# Patient Record
Sex: Male | Born: 1985 | Race: Black or African American | Hispanic: No | Marital: Married | Smoking: Former smoker
Health system: Southern US, Community
[De-identification: ages and names within clinical notes are randomized; demographics above are authoritative.]

---
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
## 2009-06-19 ENCOUNTER — Emergency Department (HOSPITAL_COMMUNITY): Admission: EM | Admit: 2009-06-19 | Discharge: 2009-06-19 | Payer: Self-pay | Admitting: Emergency Medicine
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
## 2017-12-03 ENCOUNTER — Encounter (HOSPITAL_COMMUNITY): Payer: Self-pay | Admitting: Family Medicine

## 2017-12-03 ENCOUNTER — Ambulatory Visit (HOSPITAL_COMMUNITY)
Admission: EM | Admit: 2017-12-03 | Discharge: 2017-12-03 | Disposition: A | Discharge status: Home / Self Care | Payer: Medicaid Other | Attending: Internal Medicine | Admitting: Internal Medicine

## 2017-12-03 DIAGNOSIS — J069 Acute upper respiratory infection, unspecified: Secondary | ICD-10-CM | POA: Diagnosis not present

## 2017-12-03 DIAGNOSIS — B9789 Other viral agents as the cause of diseases classified elsewhere: Secondary | ICD-10-CM

## 2017-12-03 MED ORDER — BENZONATATE 100 MG PO CAPS: 200.0000 mg | ORAL_CAPSULE | Freq: Three times a day (TID) | ORAL | 0 refills | Status: AC

## 2017-12-03 MED ORDER — FLUTICASONE PROPIONATE 50 MCG/ACT NA SUSP: 1.0000 | Freq: Every day | NASAL | 0 refills | Status: DC

## 2017-12-03 MED ORDER — CETIRIZINE HCL 10 MG PO CAPS: 10.0000 mg | ORAL_CAPSULE | Freq: Every day | ORAL | 0 refills | Status: DC

## 2017-12-03 NOTE — ED Triage Notes (Signed)
Pt here for 2 days of cough and body aches. Reports that both kids are sick at home.

## 2017-12-03 NOTE — Discharge Instructions (Signed)
You are having a viral cold. We recommended symptom control. I expect your symptoms to start improving in the next 1-2 weeks.   1. Take a daily allergy pill/anti-histamine like Zyrtec, Claritin, or Store brand consistently for 2 weeks-I have sent this in for you.  2. For congestion I have sent in Flonase to use spray in each nostril once daily, you may also try Mucinex or Sudafed.  3. For your sore throat you may try cepacol lozenges, salt water gargles, throat spray. Treatment of congestion may also help your sore throat.  4. For cough I have sent in Tessalon to use as needed.  You may also try Delsym or Robitussin over-the-counter.  5. Take Tylenol or Ibuprofen to help with pain, headache.  6. Stay hydrated, drink plenty of fluids to keep throat coated and less irritated  Honey Tea For cough/sore throat try using a honey-based tea. Use 3 teaspoons of honey with juice squeezed from half lemon. Place shaved pieces of ginger into 1/2-1 cup of water and warm over stove top. Then mix the ingredients and repeat every 4 hours as needed.

## 2017-12-03 NOTE — ED Provider Notes (Signed)
[facility]    CSN: [id number] Arrival date & time: 12/03/17  1043     History   Chief Complaint Chief Complaint  Patient presents with  . Generalized Body Aches  . Cough    HPI Eugene Lloyd is a 32 y.o. male no significant past medical history, Patient is presenting with URI symptoms- congestion, cough, mild sore throat.  Also having myalgias and a headache.  Headache feels like a pressure sensation as if he is having a head cold.  States cough is occasional, dry and infrequent.  Patient's main complaints are concerned about if he has what his kids have-were here yesterday 1 treated for pneumonia 1 treated for bronchitis. Symptoms have been going on for 2 days. Patient has tried cold and sinus over-the-counter medicine, with minimal relief. Denies fever, nausea, vomiting, diarrhea. Denies shortness of breath and chest pain.    HPI  History reviewed. No pertinent past medical history.  There are no active problems to display for this patient.   History reviewed. No pertinent surgical history.     Home Medications    Prior to Admission medications   Medication Sig Start Date End Date Taking? Authorizing Provider  benzonatate (TESSALON) 100 MG capsule Take 2 capsules (200 mg total) by mouth every 8 (eight) hours for 5 days. 12/03/17 12/08/17  Wieters, Hallie C, PA-C  Cetirizine HCl 10 MG CAPS Take 1 capsule (10 mg total) by mouth daily for 14 days. 12/03/17 12/17/17  Wieters, Hallie C, PA-C  fluticasone (FLONASE) 50 MCG/ACT nasal spray Place 1 spray into both nostrils daily for 7 days. 12/03/17 12/10/17  Wieters, Junius CreamerHallie C, PA-C    Family History History reviewed. No pertinent family history.  Social History Social History   Tobacco Use  . Smoking status: Current Every Day Smoker  . Smokeless tobacco: Never Used  Substance Use Topics  . Alcohol use: Not on file  . Drug use: Not on file     Allergies   Patient has no known allergies.   Review of  Systems Review of Systems  Constitutional: Negative for activity change, appetite change, fatigue and fever.  HENT: Positive for congestion, postnasal drip, rhinorrhea, sinus pressure and sore throat. Negative for ear pain.   Eyes: Negative for pain and itching.  Respiratory: Positive for cough. Negative for shortness of breath.   Cardiovascular: Negative for chest pain.  Gastrointestinal: Negative for abdominal pain, diarrhea, nausea and vomiting.  Musculoskeletal: Positive for myalgias.  Skin: Negative for rash.  Neurological: Positive for headaches. Negative for dizziness and light-headedness.     Physical Exam Triage Vital Signs ED Triage Vitals  Enc Vitals Group     BP 12/03/17 1130 132/74     Pulse Rate 12/03/17 1130 71     Resp 12/03/17 1130 18     Temp 12/03/17 1130 98.1 F (36.7 C)     Temp src --      SpO2 12/03/17 1130 100 %     Weight --      Height --      Head Circumference --      Peak Flow --      Pain Score 12/03/17 1128 8     Pain Loc --      Pain Edu? --      Excl. in GC? --    No data found.  Updated Vital Signs BP 132/74   Pulse 71   Temp 98.1 F (36.7 C)   Resp 18  SpO2 100%    Physical Exam  Constitutional: He appears well-developed and well-nourished. No distress.  HENT:  Head: Normocephalic and atraumatic.  Right Ear: Tympanic membrane and ear canal normal.  Left Ear: Tympanic membrane and ear canal normal.  Nose: Rhinorrhea present.  Mouth/Throat: Uvula is midline and mucous membranes are normal. No oral lesions. No trismus in the jaw. No uvula swelling. Tonsils are 1+ on the right. Tonsils are 1+ on the left. No tonsillar exudate.  Mildly erythematous turbinates, rhinorrhea present  Eyes: Conjunctivae are normal.  Neck: Neck supple.  Cardiovascular: Normal rate and regular rhythm.  No murmur heard. Pulmonary/Chest: Effort normal and breath sounds normal. No respiratory distress.  Clear to auscultation bilaterally, no adventitious  sounds appreciated  Abdominal: Soft. There is no tenderness.  Musculoskeletal: He exhibits no edema.  Neurological: He is alert.  Skin: Skin is warm and dry.  Psychiatric: He has a normal mood and affect.  Nursing note and vitals reviewed.    UC Treatments / Results  Labs (all labs ordered are listed, but only abnormal results are displayed) Labs Reviewed - No data to display  EKG  EKG Interpretation None       Radiology No results found.  Procedures Procedures (including critical care time)  Medications Ordered in UC Medications - No data to display   Initial Impression / Assessment and Plan / UC Course  I have reviewed the triage vital signs and the nursing notes.  Pertinent labs & imaging results that were available during my care of the patient were reviewed by me and considered in my medical decision making (see chart for details).     Patient presents with symptoms likely from a viral upper respiratory infection.  Patient vital signs stable, without fever or tachycardia to 100%.  Influenza less likely.  Do not suspect underlying cardiopulmonary process. Symptoms seem unlikely related to ACS, CHF or COPD exacerbations, pneumonia, pneumothorax. Patient is nontoxic appearing and not in need of emergent medical intervention.  Flonase Zyrtec and Tessalon provided. Recommended symptom control with over the counter medications: Daily oral anti-histamine, Oral decongestant or IN corticosteroid, saline irrigations, cepacol lozenges, Robitussin, Delsym, honey tea.  Return if symptoms fail to improve in 1-2 weeks or you develop shortness of breath, chest pain, severe headache. Patient states understanding and is agreeable.     Final Clinical Impressions(s) / UC Diagnoses   Final diagnoses:  Viral URI with cough    ED Discharge Orders        Ordered    Cetirizine HCl 10 MG CAPS  Daily     12/03/17 1216    fluticasone (FLONASE) 50 MCG/ACT nasal spray  Daily      12/03/17 1216    benzonatate (TESSALON) 100 MG capsule  Every 8 hours     12/03/17 1216       Controlled Substance Prescriptions [place] Controlled Substance Registry consulted? Not Applicable   Lew Dawes, [place] 12/03/17 1225

## 2018-04-11 ENCOUNTER — Encounter (HOSPITAL_COMMUNITY): Payer: Self-pay | Admitting: Emergency Medicine

## 2018-04-11 ENCOUNTER — Emergency Department (HOSPITAL_COMMUNITY)
Admission: EM | Admit: 2018-04-11 | Discharge: 2018-04-11 | Disposition: A | Discharge status: Home / Self Care | Payer: Medicaid Other | Attending: Emergency Medicine | Admitting: Emergency Medicine

## 2018-04-11 DIAGNOSIS — F17210 Nicotine dependence, cigarettes, uncomplicated: Secondary | ICD-10-CM | POA: Insufficient documentation

## 2018-04-11 DIAGNOSIS — K0889 Other specified disorders of teeth and supporting structures: Secondary | ICD-10-CM

## 2018-04-11 MED ORDER — NAPROXEN 500 MG PO TABS: 500.0000 mg | ORAL_TABLET | Freq: Two times a day (BID) | ORAL | 0 refills | Status: DC

## 2018-04-11 MED ORDER — PENICILLIN V POTASSIUM 500 MG PO TABS: 500.0000 mg | ORAL_TABLET | Freq: Three times a day (TID) | ORAL | 0 refills | Status: DC

## 2018-04-11 NOTE — ED Provider Notes (Signed)
[facility] EMERGENCY DEPARTMENT Provider Note   CSN: [id number] Arrival date & time: 04/11/18  1918     History   Chief Complaint Chief Complaint  Patient presents with  . Dental Pain    HPI Eugene Lloyd is a 32 y.o. male.  Patient presents with left-sided dental pain in the left upper wisdom tooth ongoing for the past 2 days.  Patient has a sharp pain that radiates up into his face.  He states that he has been gargling with hydrogen peroxide which helps symptoms temporarily.  No other treatments prior to arrival.  No neck pain or trouble breathing or swallowing.  No fevers.  Onset of symptoms acute.  Course is intermittent.     History reviewed. No pertinent past medical history.  There are no active problems to display for this patient.   History reviewed. No pertinent surgical history.      Home Medications    Prior to Admission medications   Medication Sig Start Date End Date Taking? Authorizing Provider  Cetirizine HCl 10 MG CAPS Take 1 capsule (10 mg total) by mouth daily for 14 days. 12/03/17 12/17/17  Wieters, Hallie C, PA-C  fluticasone (FLONASE) 50 MCG/ACT nasal spray Place 1 spray into both nostrils daily for 7 days. 12/03/17 12/10/17  Wieters, Hallie C, PA-C  naproxen (NAPROSYN) 500 MG tablet Take 1 tablet (500 mg total) by mouth 2 (two) times daily. 04/11/18   Renne Crigler, PA-C  penicillin v potassium (VEETID) 500 MG tablet Take 1 tablet (500 mg total) by mouth 3 (three) times daily. 04/11/18   Renne Crigler, PA-C    Family History No family history on file.  Social History Social History   Tobacco Use  . Smoking status: Current Every Day Smoker  . Smokeless tobacco: Never Used  Substance Use Topics  . Alcohol use: Not on file  . Drug use: Not on file     Allergies   Patient has no known allergies.   Review of Systems Review of Systems  Constitutional: Negative for fever.  HENT: Positive for dental problem and ear  pain. Negative for facial swelling, sore throat and trouble swallowing.   Respiratory: Negative for shortness of breath and stridor.   Musculoskeletal: Negative for neck pain.  Skin: Negative for color change.  Neurological: Negative for headaches.     Physical Exam Updated Vital Signs BP (!) 130/91 (BP Location: Right Arm)   Pulse 77   Temp 99 F (37.2 C) (Oral)   Resp 16   Ht 5\' 9"  (1.753 m)   Wt 72.6 kg (160 lb)   SpO2 98%   BMI 23.63 kg/m   Physical Exam  Constitutional: He appears well-developed and well-nourished.  HENT:  Head: Normocephalic and atraumatic.  Right Ear: Tympanic membrane, external ear and ear canal normal.  Left Ear: Tympanic membrane, external ear and ear canal normal.  Nose: Nose normal.  Mouth/Throat: Uvula is midline, oropharynx is clear and moist and mucous membranes are normal. No trismus in the jaw. Abnormal dentition. Dental caries present. No dental abscesses or uvula swelling. No tonsillar abscesses.  Tooth #16 is broken.  No gross abscess or swelling of the gums.  No facial swelling.  Eyes: Pupils are equal, round, and reactive to light.  Neck: Normal range of motion. Neck supple.  No neck swelling or Lugwig's angina  Neurological: He is alert.  Skin: Skin is warm and dry.  Psychiatric: He has a normal mood and affect.  Nursing  note and vitals reviewed.    ED Treatments / Results  Labs (all labs ordered are listed, but only abnormal results are displayed) Labs Reviewed - No data to display  EKG None  Radiology No results found.  Procedures Procedures (including critical care time)  Medications Ordered in ED Medications - No data to display   Initial Impression / Assessment and Plan / ED Course  I have reviewed the triage vital signs and the nursing notes.  Pertinent labs & imaging results that were available during my care of the patient were reviewed by me and considered in my medical decision making (see chart for  details).     10:28 PM Patient seen and examined.    Vital signs reviewed and are as follows: BP (!) 130/91 (BP Location: Right Arm)   Pulse 77   Temp 99 F (37.2 C) (Oral)   Resp 16   Ht 5\' 9"  (1.753 m)   Wt 72.6 kg (160 lb)   SpO2 98%   BMI 23.63 kg/m   Patient counseled to take prescribed medications as directed, return with worsening facial or neck swelling, and to follow-up with their dentist as soon as possible.    Final Clinical Impressions(s) / ED Diagnoses   Final diagnoses:  Pain, dental   Patient with toothache. No fever. Exam unconcerning for Ludwig's angina or other deep tissue infection in neck.   Will treat with antibiotic and pain medicine. Urged patient to follow-up with dentist.    ED Discharge Orders        Ordered    penicillin v potassium (VEETID) 500 MG tablet  3 times daily     04/11/18 2224    naproxen (NAPROSYN) 500 MG tablet  2 times daily     04/11/18 2224       Renne CriglerGeiple, Namon Villarin, PA-C 04/11/18 2228    Loren RacerYelverton, David, MD 04/14/18 1359

## 2018-04-11 NOTE — ED Triage Notes (Signed)
Pt reports L sided dental pain X3 days. Pt states he thinks his wisdom teeth need to be removed.

## 2018-04-11 NOTE — Discharge Instructions (Signed)
Please read and follow all provided instructions.  Your diagnoses today include:  1. Pain, dental     The exam and treatment you received today has been provided on an emergency basis only. This is not a substitute for complete medical or dental care.  Tests performed today include:  Vital signs. See below for your results today.   Medications prescribed:   Penicillin - antibiotic  You have been prescribed an antibiotic medicine: take the entire course of medicine even if you are feeling better. Stopping early can cause the antibiotic not to work.   Naproxen - anti-inflammatory pain medication  Do not exceed 500mg  naproxen every 12 hours, take with food  You have been prescribed an anti-inflammatory medication or NSAID. Take with food. Take smallest effective dose for the shortest duration needed for your pain. Stop taking if you experience stomach pain or vomiting.   Take any prescribed medications only as directed.  Home care instructions:  Follow any educational materials contained in this packet.  Follow-up instructions: Please follow-up with your dentist for further evaluation of your symptoms.   Return instructions:   Please return to the Emergency Department if you experience worsening symptoms.  Please return if you develop a fever, you develop more swelling in your face or neck, you have trouble breathing or swallowing food.  Please return if you have any other emergent concerns.  Additional Information:  Your vital signs today were: BP (!) 130/91 (BP Location: Right Arm)    Pulse 77    Temp 99 F (37.2 C) (Oral)    Resp 16    Ht 5\' 9"  (1.753 m)    Wt 72.6 kg (160 lb)    SpO2 98%    BMI 23.63 kg/m  If your blood pressure (BP) was elevated above 135/85 this visit, please have this repeated by your doctor within one month. --------------

## 2020-10-19 ENCOUNTER — Ambulatory Visit
Admission: EM | Admit: 2020-10-19 | Discharge: 2020-10-19 | Disposition: A | Discharge status: Home / Self Care | Payer: Medicaid Other | Attending: Emergency Medicine | Admitting: Emergency Medicine

## 2020-10-19 ENCOUNTER — Other Ambulatory Visit: Payer: Self-pay

## 2020-10-19 ENCOUNTER — Encounter: Payer: Self-pay | Admitting: Emergency Medicine

## 2020-10-19 DIAGNOSIS — J069 Acute upper respiratory infection, unspecified: Secondary | ICD-10-CM

## 2020-10-19 DIAGNOSIS — Z1152 Encounter for screening for COVID-19: Secondary | ICD-10-CM

## 2020-10-19 MED ORDER — BENZONATATE 100 MG PO CAPS: 100.0000 mg | ORAL_CAPSULE | Freq: Three times a day (TID) | ORAL | 0 refills | Status: DC | PRN

## 2020-10-19 MED ORDER — CETIRIZINE HCL 10 MG PO TABS: 10.0000 mg | ORAL_TABLET | Freq: Every day | ORAL | 0 refills | Status: DC

## 2020-10-19 MED ORDER — PREDNISONE 10 MG PO TABS: 20.0000 mg | ORAL_TABLET | Freq: Every day | ORAL | 0 refills | Status: DC

## 2020-10-19 MED ORDER — FLUTICASONE PROPIONATE 50 MCG/ACT NA SUSP: 1.0000 | Freq: Every day | NASAL | 0 refills | Status: DC

## 2020-10-19 NOTE — ED Triage Notes (Signed)
Headache, fever, coughing and sore throat that started Wednesday.  Sore throat has resolved.

## 2020-10-19 NOTE — ED Provider Notes (Signed)
[facility] CARE CENTER   [id number] 10/19/20 Arrival Time: 1348   CC: COVID symptoms  SUBJECTIVE: History from: patient and family.  Eugene Lloyd is a 34 y.o. male who presented to the urgent care for complaint of fever, sore throat, cough and headache for the past 3 days.  Denies sick exposure to COVID, flu or strep.  Denies recent travel.  Has tried OTC medication was without relief.  Denies any aggravating factors.  Deniesprevious symptoms in the past.   Denies  fatigue, sinus pain, rhinorrhea, sore cough, SOB, wheezing, chest pain, nausea, changes in bowel or bladder habits.    ROS: As per HPI.  All other pertinent ROS negative.     History reviewed. No pertinent past medical history. History reviewed. No pertinent surgical history. No Known Allergies No current facility-administered medications on file prior to encounter.   Current Outpatient Medications on File Prior to Encounter  Medication Sig Dispense Refill  . naproxen (NAPROSYN) 500 MG tablet Take 1 tablet (500 mg total) by mouth 2 (two) times daily. 20 tablet 0  . penicillin v potassium (VEETID) 500 MG tablet Take 1 tablet (500 mg total) by mouth 3 (three) times daily. 21 tablet 0   Social History   Socioeconomic History  . Marital status: Married    Spouse name: Not on file  . Number of children: Not on file  . Years of education: Not on file  . Highest education level: Not on file  Occupational History  . Not on file  Tobacco Use  . Smoking status: Former Games developer  . Smokeless tobacco: Never Used  Substance and Sexual Activity  . Alcohol use: Never  . Drug use: Never  . Sexual activity: Not on file  Other Topics Concern  . Not on file  Social History Narrative  . Not on file   Social Determinants of Health   Financial Resource Strain: Not on file  Food Insecurity: Not on file  Transportation Needs: Not on file  Physical Activity: Not on file  Stress: Not on file  Social Connections: Not on file   Intimate Partner Violence: Not on file   No family history on file.  OBJECTIVE:  Vitals:   10/19/20 1431 10/19/20 1433  BP:  132/85  Pulse:  83  Resp:  18  Temp:  98.7 F (37.1 C)  TempSrc:  Oral  SpO2:  96%  Weight: 197 lb (89.4 kg)   Height: 5\' 8"  (1.727 m)      General appearance: alert; appears fatigued, but nontoxic; speaking in full sentences and tolerating own secretions HEENT: NCAT; Ears: EACs clear, TMs pearly gray; Eyes: PERRL.  EOM grossly intact. Sinuses: nontender; Nose: nares patent without rhinorrhea, Throat: oropharynx clear, tonsils non erythematous or enlarged, uvula midline  Neck: supple without LAD Lungs: unlabored respirations, symmetrical air entry; cough: moderate; no respiratory distress; CTAB Heart: regular rate and rhythm.  Radial pulses 2+ symmetrical bilaterally Skin: warm and dry Psychological: alert and cooperative; normal mood and affect  LABS:  No results found for this or any previous visit (from the past 24 hour(s)).   ASSESSMENT & PLAN:  1. URI with cough and congestion   2. Encounter for screening for COVID-19     Meds ordered this encounter  Medications  . cetirizine (ZYRTEC ALLERGY) 10 MG tablet    Sig: Take 1 tablet (10 mg total) by mouth daily.    Dispense:  30 tablet    Refill:  0  . fluticasone (FLONASE) 50  MCG/ACT nasal spray    Sig: Place 1 spray into both nostrils daily for 14 days.    Dispense:  16 g    Refill:  0  . benzonatate (TESSALON) 100 MG capsule    Sig: Take 1 capsule (100 mg total) by mouth 3 (three) times daily as needed for cough.    Dispense:  30 capsule    Refill:  0  . predniSONE (DELTASONE) 10 MG tablet    Sig: Take 2 tablets (20 mg total) by mouth daily.    Dispense:  15 tablet    Refill:  0    Discharge instructions  COVID for 19, flu A/B testing ordered.  It will take between 2-7 days for test results.  Someone will contact you regarding abnormal results.    In the meantime: You should  remain isolated in your home for 10 days from symptom onset AND greater than 24 hours after symptoms resolution (absence of fever without the use of fever-reducing medication and improvement in respiratory symptoms), whichever is longer Get plenty of rest and push fluids Tessalon Perles prescribed for cough Zyrtec for nasal congestion, runny nose, and/or sore throat Flonase for nasal congestion and runny nos Prednisone was prescribed Use medications daily for symptom relief Use OTC medications like ibuprofen or tylenol as needed fever or pain Call or go to the ED if you have any new or worsening symptoms such as fever, worsening cough, shortness of breath, chest tightness, chest pain, turning blue, changes in mental status, etc...   Reviewed expectations re: course of current medical issues. Questions answered. Outlined signs and symptoms indicating need for more acute intervention. Patient verbalized understanding. After Visit Summary given.         Durward Parcel, FNP 10/19/20 1449

## 2020-10-19 NOTE — Discharge Instructions (Addendum)
COVID for 19, flu A/B testing ordered.  It will take between 2-7 days for test results.  Someone will contact you regarding abnormal results.    In the meantime: You should remain isolated in your home for 10 days from symptom onset AND greater than 24 hours after symptoms resolution (absence of fever without the use of fever-reducing medication and improvement in respiratory symptoms), whichever is longer Get plenty of rest and push fluids Tessalon Perles prescribed for cough Zyrtec for nasal congestion, runny nose, and/or sore throat Flonase for nasal congestion and runny nose Prednisone was prescribed Use medications daily for symptom relief Use OTC medications like ibuprofen or tylenol as needed fever or pain Call or go to the ED if you have any new or worsening symptoms such as fever, worsening cough, shortness of breath, chest tightness, chest pain, turning blue, changes in mental status, etc..Marland Kitchen

## 2020-10-24 LAB — COVID-19, FLU A+B NAA
Influenza A, NAA: NOT DETECTED
Influenza B, NAA: NOT DETECTED
SARS-CoV-2, NAA: DETECTED — AB

## 2022-11-30 ENCOUNTER — Ambulatory Visit
Admission: EM | Admit: 2022-11-30 | Discharge: 2022-11-30 | Disposition: A | Discharge status: Home / Self Care | Payer: Medicaid Other

## 2022-11-30 ENCOUNTER — Telehealth: Payer: Self-pay | Admitting: Internal Medicine

## 2022-11-30 DIAGNOSIS — U071 COVID-19: Secondary | ICD-10-CM | POA: Diagnosis not present

## 2022-11-30 MED ORDER — BENZONATATE 200 MG PO CAPS: 200.0000 mg | ORAL_CAPSULE | Freq: Three times a day (TID) | ORAL | 0 refills | Status: AC | PRN

## 2022-11-30 NOTE — ED Provider Notes (Signed)
[facility]    CSN: [id number] Arrival date & time: 11/30/22  1406      History   Chief Complaint Chief Complaint  Patient presents with   Cough   Generalized Body Aches    HPI BREWSTER WOLTERS is a 37 y.o. male who presents with onset of nose congestion, rhinitis, cough, fatigue, chills and body aches since yesterday. He did a covid test and turned positive. He took a dose of his wife's Paxlovid medication yesterday. Had covid 2 years ago. His fever was as high as 102. Has not had covid shots or ever had covid infection. Needs a note for work. Has been sweating a lot. Denies any chronic conditions. He took his wife's medication since she was feeling better and did not feel she needed to finish it.     History reviewed. No pertinent past medical history.  There are no problems to display for this patient.   History reviewed. No pertinent surgical history.   Home Medications    Prior to Admission medications   Not on File    Family History History reviewed. No pertinent family history.  Social History Social History   Tobacco Use   Smoking status: Former   Smokeless tobacco: Never  Substance Use Topics   Alcohol use: Never   Drug use: Never     Allergies   Patient has no known allergies.   Review of Systems Review of Systems  Constitutional:  Positive for activity change, appetite change, chills, diaphoresis, fatigue and fever.  HENT:  Positive for congestion, postnasal drip and rhinorrhea. Negative for ear pain, facial swelling and sore throat.   Eyes:  Negative for discharge.  Respiratory:  Positive for cough. Negative for chest tightness and shortness of breath.   Cardiovascular:  Negative for chest pain.  Musculoskeletal:  Positive for myalgias.  Neurological:  Positive for headaches.     Physical Exam Triage Vital Signs ED Triage Vitals  Enc Vitals Group     BP 11/30/22 1554 (!) 136/92     Pulse Rate 11/30/22 1554 75     Resp  11/30/22 1554 16     Temp 11/30/22 1554 99.3 F (37.4 C)     Temp Source 11/30/22 1554 Oral     SpO2 11/30/22 1554 97 %     Weight --      Height --      Head Circumference --      Peak Flow --      Pain Score 11/30/22 1600 5     Pain Loc --      Pain Edu? --      Excl. in [place]? --    No data found.  Updated Vital Signs BP (!) 136/92 (BP Location: Right Arm)   Pulse 75   Temp 99.3 F (37.4 C) (Oral)   Resp 16   Wt 192 lb (87.1 kg)   SpO2 97%   BMI 29.19 kg/m   Visual Acuity Right Eye Distance:   Left Eye Distance:   Bilateral Distance:    Right Eye Near:   Left Eye Near:    Bilateral Near:       Physical Exam Vitals signs and nursing note reviewed.  Constitutional:      General: he is not in acute distress.    Appearance: Normal appearance, he is not ill-appearing, toxic-appearing or diaphoretic.  HENT:     Head: Normocephalic.     Right Ear: Tympanic membrane, ear canal and external  ear normal.     Left Ear: Tympanic membrane, ear canal and external ear normal.     Nose: Nose normal.     Mouth/Throat: clear    Mouth: Mucous membranes are moist.  Eyes:     General: No scleral icterus.       Right eye: No discharge.        Left eye: No discharge.     Conjunctiva/sclera: Conjunctivae normal.  Neck:     Musculoskeletal: Neck supple. No neck rigidity.  Cardiovascular:     Rate and Rhythm: Normal rate and regular rhythm.     Heart sounds: No murmur.  Pulmonary:     Effort: Pulmonary effort is normal.     Breath sounds: Normal breath sounds.  Musculoskeletal: Normal range of motion.  Lymphadenopathy:     Cervical: No cervical adenopathy.  Skin:    General: Skin is warm and dry.     Coloration: Skin is not jaundiced.     Findings: No rash.  Neurological:     Mental Status: She is alert and oriented to person, place, and time.     Gait: Gait normal.  Psychiatric:        Mood and Affect: Mood normal.        Behavior: Behavior normal.        Thought  Content: Thought content normal.        Judgment: Judgment normal.   UC Treatments / Results  Labs (all labs ordered are listed, but only abnormal results are displayed) Labs Reviewed - No data to display  EKG   Radiology No results found.  Procedures Procedures (including critical care time)  Medications Ordered in UC Medications - No data to display  Initial Impression / Assessment and Plan / UC Course  I have reviewed the triage vital signs and the nursing notes.  Covid infection  Supportive care reviewed. See instructions.    Final Clinical Impressions(s) / UC Diagnoses   Final diagnoses:  COVID-19 virus infection     Discharge Instructions      You may return to work 2/3 but need to wear a mask for 5 more days. You may take the following supplements to help your immune system fight Covid since you dont have any chronic condition. Take Quarcetin 500 mg three times a day x 7 days with Zinc 50 mg ones a day x 7 days. The quarcetin is an antiviral and anti-inflammatory supplement which helps open the zinc channels in the cell to absorb Zinc. Zinc helps decrease the virus load in your body. Take Melatonin 6-10 mg at bed time which also helps support your immune system.  Also make sure to take Vit D 5,000 IU per day with a fatty meal and Vit C 5000 mg a day until you are completely better. To prevent viral illnesses your vitamin D should be between 60-80. Stay on Vitamin D 2,000  and C  1000 mg the rest of the season.  Don't lay around, keep active and walk as much as you are able to to prevent worsening of your symptoms.  Follow up with your family Dr next week.  If you get short of breath and you are able to check  your oxygen with a pulse oxygen meter, if it gets to 92% or less, you need to go to the hospital to be admitted. If you dont have one, come back here and we will assess you.   Nitric oxide gets depleted so, you may  consume foods rich in it. Nitric oxide is a  gas we make within our own bodies from nitrates and nitrites found naturally in our foods. Good sources include dark green leafy vegetables like kale, arugula, Swiss Chard and spinach. Other great sources include beets, cabbage, cauliflower, carrots and broccoli. You can make smoothies with some of those vegestables.     ED Prescriptions   None    PDMP not reviewed this encounter.   Shelby Mattocks, [place] 11/30/22 1644

## 2022-11-30 NOTE — ED Triage Notes (Signed)
Took a home COVID test yesterday and was positive. Congestion, cough, runny nose, weakness, chills, body aches  that started yesterday. Taking ibuprofen and one dose of wife's paxlovid medication.

## 2022-11-30 NOTE — Telephone Encounter (Signed)
I sent tessalon for his cough. He was explained he does not qualify for antivirals due to not having chronic conditions and BMI is less than 30

## 2022-11-30 NOTE — Discharge Instructions (Addendum)
You may return to work 2/3 but need to wear a mask for 5 more days. You may take the following supplements to help your immune system fight Covid since you dont have any chronic condition. Take Quarcetin 500 mg three times a day x 7 days with Zinc 50 mg ones a day x 7 days. The quarcetin is an antiviral and anti-inflammatory supplement which helps open the zinc channels in the cell to absorb Zinc. Zinc helps decrease the virus load in your body. Take Melatonin 6-10 mg at bed time which also helps support your immune system.  Also make sure to take Vit D 5,000 IU per day with a fatty meal and Vit C 5000 mg a day until you are completely better. To prevent viral illnesses your vitamin D should be between 60-80. Stay on Vitamin D 2,000  and C  1000 mg the rest of the season.  Don't lay around, keep active and walk as much as you are able to to prevent worsening of your symptoms.  Follow up with your family Dr next week.  If you get short of breath and you are able to check  your oxygen with a pulse oxygen meter, if it gets to 92% or less, you need to go to the hospital to be admitted. If you dont have one, come back here and we will assess you.   Nitric oxide gets depleted so, you may consume foods rich in it. Nitric oxide is a gas we make within our own bodies from nitrates and nitrites found naturally in our foods. Good sources include dark green leafy vegetables like kale, arugula, Swiss Chard and spinach. Other great sources include beets, cabbage, cauliflower, carrots and broccoli. You can make smoothies with some of those vegestables.

## 2022-12-06 ENCOUNTER — Emergency Department (HOSPITAL_COMMUNITY)
Admission: EM | Admit: 2022-12-06 | Discharge: 2022-12-06 | Disposition: A | Discharge status: Home / Self Care | Payer: Medicaid Other | Attending: Emergency Medicine | Admitting: Emergency Medicine

## 2022-12-06 ENCOUNTER — Ambulatory Visit (HOSPITAL_COMMUNITY)
Admission: EM | Admit: 2022-12-06 | Discharge: 2022-12-06 | Disposition: A | Discharge status: Home / Self Care | Payer: Medicaid Other

## 2022-12-06 ENCOUNTER — Encounter (HOSPITAL_COMMUNITY): Payer: Self-pay

## 2022-12-06 ENCOUNTER — Other Ambulatory Visit: Payer: Self-pay

## 2022-12-06 DIAGNOSIS — Z23 Encounter for immunization: Secondary | ICD-10-CM | POA: Insufficient documentation

## 2022-12-06 DIAGNOSIS — Y9389 Activity, other specified: Secondary | ICD-10-CM | POA: Insufficient documentation

## 2022-12-06 DIAGNOSIS — S0181XA Laceration without foreign body of other part of head, initial encounter: Secondary | ICD-10-CM | POA: Diagnosis present

## 2022-12-06 DIAGNOSIS — Y998 Other external cause status: Secondary | ICD-10-CM | POA: Diagnosis not present

## 2022-12-06 DIAGNOSIS — W268XXA Contact with other sharp object(s), not elsewhere classified, initial encounter: Secondary | ICD-10-CM | POA: Insufficient documentation

## 2022-12-06 DIAGNOSIS — Y9289 Other specified places as the place of occurrence of the external cause: Secondary | ICD-10-CM | POA: Diagnosis not present

## 2022-12-06 MED ORDER — CEPHALEXIN 500 MG PO CAPS: 500.0000 mg | ORAL_CAPSULE | Freq: Four times a day (QID) | ORAL | 0 refills | Status: AC

## 2022-12-06 MED ORDER — TETANUS-DIPHTH-ACELL PERTUSSIS 5-2.5-18.5 LF-MCG/0.5 IM SUSY
0.5000 mL | PREFILLED_SYRINGE | Freq: Once | INTRAMUSCULAR | Status: AC
Start: 2022-12-06 — End: 2022-12-06
  Administered 2022-12-06: 0.5 mL via INTRAMUSCULAR
  Filled 2022-12-06: qty 0.5

## 2022-12-06 MED ORDER — LORAZEPAM 1 MG PO TABS
1.0000 mg | ORAL_TABLET | Freq: Once | ORAL | Status: AC
Start: 2022-12-06 — End: 2022-12-06
  Administered 2022-12-06: 1 mg via ORAL
  Filled 2022-12-06: qty 1

## 2022-12-06 MED ORDER — LIDOCAINE-EPINEPHRINE (PF) 2 %-1:200000 IJ SOLN
10.0000 mL | Freq: Once | INTRAMUSCULAR | Status: AC
  Administered 2022-12-06: 10 mL via INTRADERMAL
  Filled 2022-12-06: qty 20

## 2022-12-06 NOTE — ED Triage Notes (Signed)
Last Tdap unknown.

## 2022-12-06 NOTE — ED Provider Notes (Signed)
[facility]   CSN: [id number] Arrival date & time: 12/06/22  1426     History  Chief Complaint  Patient presents with   Facial Laceration    Eugene Lloyd is a 37 y.o. male.  HPI   37 year old male presenting to the emergency department with a right cheek laceration.  The patient states that a metal tote hit his face and he was wearing glasses at the time.  He sustained a laceration to his face just lateral to the right eye.  He denies any vision loss, blurry vision, sensation of foreign body in his eye.  The lacerations bleeding is controlled.  He is not on anticoagulation.  No vision changes, headache, nausea and vomiting.  No LOC.  His tetanus status is not up-to-date.  He initially presented to urgent care but was sent to the emergency department for laceration repair.  Home Medications Prior to Admission medications   Medication Sig Start Date End Date Taking? Authorizing Provider  cephALEXin (KEFLEX) 500 MG capsule Take 1 capsule (500 mg total) by mouth 4 (four) times daily. 12/06/22  Yes Regan Lemming, MD  amoxicillin (AMOXIL) 500 MG capsule Take 500 mg by mouth 3 (three) times daily. 11/10/22   [provider]  benzonatate (TESSALON) 200 MG capsule Take 1 capsule (200 mg total) by mouth 3 (three) times daily as needed for cough. 11/30/22   Rodriguez-Southworth, Sunday Spillers, PA-C  ibuprofen (ADVIL) 800 MG tablet Take 800 mg by mouth every 8 (eight) hours as needed. 11/10/22   [provider]  traMADol (ULTRAM) 50 MG tablet Take 50 mg by mouth every 8 (eight) hours as needed. 11/10/22   [provider]      Allergies    Patient has no known allergies.    Review of Systems   Review of Systems  All other systems reviewed and are negative.   Physical Exam Updated Vital Signs BP 114/81   Pulse (!) 58   Temp 97.8 F (36.6 C) (Oral)   Resp 16   Ht 5\' 8"  (1.727 m)   Wt 87.1 kg   SpO2 100%    BMI 29.19 kg/m  Physical Exam Vitals and nursing note reviewed.  Constitutional:      General: He is not in acute distress. HENT:     Head: Normocephalic and atraumatic.     Comments: Right cheek laceration, 1.5cm, just lateral to the lateral canthus, hemostatic Eyes:     Conjunctiva/sclera: Conjunctivae normal.     Pupils: Pupils are equal, round, and reactive to light.  Cardiovascular:     Rate and Rhythm: Normal rate and regular rhythm.  Pulmonary:     Effort: Pulmonary effort is normal. No respiratory distress.  Abdominal:     General: There is no distension.     Tenderness: There is no guarding.  Musculoskeletal:        General: No deformity or signs of injury.     Cervical back: Neck supple.  Skin:    Findings: No lesion or rash.  Neurological:     General: No focal deficit present.     Mental Status: He is alert. Mental status is at baseline.     ED Results / Procedures / Treatments   Labs (all labs ordered are listed, but only abnormal results are displayed) Labs Reviewed - No data to display  EKG None  Radiology No results found.  Procedures .Marland KitchenLaceration Repair  Date/Time: 12/06/2022 4:29 PM  Performed by: Regan Lemming, MD Authorized by: Regan Lemming, MD   Consent:    Consent obtained:  Verbal   Consent given by:  Patient   Risks discussed:  Infection, pain and poor cosmetic result Universal protocol:    Patient identity confirmed:  Verbally with patient Anesthesia:    Anesthesia method:  Local infiltration   Local anesthetic:  Lidocaine 1% WITH epi Laceration details:    Location:  Face   Face location:  R cheek   Length (cm):  1.5   Depth (mm):  2 Treatment:    Area cleansed with:  Saline   Amount of cleaning:  Standard Skin repair:    Repair method:  Sutures   Suture size:  5-0   Wound skin closure material used: Vicryl Rapide.   Number of sutures:  6 Approximation:    Approximation:  Close Repair type:    Repair type:   Intermediate Post-procedure details:    Dressing:  Open (no dressing)   Procedure completion:  Tolerated     Medications Ordered in ED Medications  Tdap (BOOSTRIX) injection 0.5 mL (0.5 mLs Intramuscular Given 12/06/22 1510)  lidocaine-EPINEPHrine (XYLOCAINE W/EPI) 2 %-1:200000 (PF) injection 10 mL (10 mLs Intradermal Given by Other 12/06/22 1626)  LORazepam (ATIVAN) tablet 1 mg (1 mg Oral Given 12/06/22 1626)    ED Course/ Medical Decision Making/ A&P                             Medical Decision Making Risk Prescription drug management.     37 year old male presenting to the emergency department with a right cheek laceration.  The patient states that a metal tote hit his face and he was wearing glasses at the time.  He sustained a laceration to his face just lateral to the right eye.  He denies any vision loss, blurry vision, sensation of foreign body in his eye.  The lacerations bleeding is controlled.  He is not on anticoagulation.  No vision changes, headache, nausea and vomiting.  No LOC.  His tetanus status is not up-to-date.  He initially presented to urgent care but was sent to the emergency department for laceration repair.  On arrival, the patient was vitally stable.  Physical exam significant for right cheek laceration, 1.5 cm in length, just lateral to the lateral canthus, hemostatic in a U-shaped distribution.  Patient's tetanus was updated on arrival.  Discussed with the patient laceration repair and wound care instructions.  The laceration itself does not extensively involve the eyelid.  I do not think he needs referral for oculoplastics at this time.  The wound was cleaned with saline and gauze and anesthetized with lidocaine.  Wound repair was carried out with six 5-0 Vicryl Rapide sutures with good approximation of wound margins.  Will discharge the patient on a course of Keflex, advised wound care instructions, stable for outpatient management at this time.   Final Clinical  Impression(s) / ED Diagnoses Final diagnoses:  Facial laceration, initial encounter    Rx / DC Orders ED Discharge Orders          Ordered    cephALEXin (KEFLEX) 500 MG capsule  4 times daily        12/06/22 1631              Regan Lemming, MD 12/06/22 1631

## 2022-12-06 NOTE — ED Triage Notes (Signed)
Patient was helping move a container off a truck and his friends hand slipped and the container his glasses which caused a lac to his right side of his face by eyebrow. Unknown last tetanus

## 2022-12-06 NOTE — ED Provider Notes (Addendum)
[place]    CSN: [id number] Arrival date & time: 12/06/22  1305      History   Chief Complaint Chief Complaint  Patient presents with   Facial Laceration    HPI Eugene Lloyd is a 37 y.o. male.   Patient presents to urgent care for evaluation of laceration to the lateral aspect of the right eye that happened a few hours ago when he was helping his neighbor move a metal tote. The metal tote hit his face to the right eye. Patient was wearing glasses at the time of the injury and states that his glasses now have a dent in them due to the injury. When the metal tote struck his face, his glasses struck the face causing laceration.  Laceration bled initially, bleeding is controlled now.  He does not use anticoagulants.  He denies vision changes, headache, nausea and vomiting after the injury, dizziness, and lightheadedness.  Unknown known last date of tetanus injection.     History reviewed. No pertinent past medical history.  There are no problems to display for this patient.   History reviewed. No pertinent surgical history.     Home Medications    Prior to Admission medications   Medication Sig Start Date End Date Taking? Authorizing Provider  amoxicillin (AMOXIL) 500 MG capsule Take 500 mg by mouth 3 (three) times daily. 11/10/22  Yes [provider]  ibuprofen (ADVIL) 800 MG tablet Take 800 mg by mouth every 8 (eight) hours as needed. 11/10/22  Yes [provider]  traMADol (ULTRAM) 50 MG tablet Take 50 mg by mouth every 8 (eight) hours as needed. 11/10/22  Yes [provider]  benzonatate (TESSALON) 200 MG capsule Take 1 capsule (200 mg total) by mouth 3 (three) times daily as needed for cough. 11/30/22   Rodriguez-Southworth, Sunday Spillers, PA-C    Family History History reviewed. No pertinent family history.  Social History Social History   Tobacco Use   Smoking status: Former   Smokeless tobacco: Never  Substance Use Topics    Alcohol use: Never   Drug use: Never     Allergies   Patient has no known allergies.   Review of Systems Review of Systems Per HPI  Physical Exam Triage Vital Signs ED Triage Vitals [12/06/22 1339]  Enc Vitals Group     BP 131/85     Pulse Rate 78     Resp 16     Temp 98.4 F (36.9 C)     Temp Source Oral     SpO2 98 %     Weight      Height      Head Circumference      Peak Flow      Pain Score      Pain Loc      Pain Edu?      Excl. in [place]?    No data found.  Updated Vital Signs BP 131/85 (BP Location: Left Arm)   Pulse 78   Temp 98.4 F (36.9 C) (Oral)   Resp 16   SpO2 98%   Visual Acuity Right Eye Distance:   Left Eye Distance:   Bilateral Distance:    Right Eye Near:   Left Eye Near:    Bilateral Near:     Physical Exam Vitals and nursing note reviewed.  Constitutional:      Appearance: He is not ill-appearing or toxic-appearing.  HENT:     Head: Normocephalic and atraumatic.  Right Ear: Hearing and external ear normal.     Left Ear: Hearing and external ear normal.     Nose: Nose normal.     Mouth/Throat:     Lips: Pink.     Mouth: Mucous membranes are moist.     Pharynx: No posterior oropharyngeal erythema.  Eyes:     General: Lids are normal. Vision grossly intact. Gaze aligned appropriately.        Right eye: No discharge.        Left eye: No discharge.     Extraocular Movements: Extraocular movements intact.     Conjunctiva/sclera: Conjunctivae normal.     Pupils: Pupils are equal, round, and reactive to light.     Comments: Laceration present to the face laterally to the right eye. See image below for further details.  EOMs intact without pain or dizziness elicited.  Pulmonary:     Effort: Pulmonary effort is normal.  Musculoskeletal:     Cervical back: Neck supple.  Skin:    General: Skin is warm and dry.     Capillary Refill: Capillary refill takes less than 2 seconds.     Findings: No rash.  Neurological:     General:  No focal deficit present.     Mental Status: He is alert and oriented to person, place, and time. Mental status is at baseline.     Cranial Nerves: No dysarthria or facial asymmetry.  Psychiatric:        Mood and Affect: Mood normal.        Speech: Speech normal.        Behavior: Behavior normal.        Thought Content: Thought content normal.        Judgment: Judgment normal.      UC Treatments / Results  Labs (all labs ordered are listed, but only abnormal results are displayed) Labs Reviewed - No data to display  EKG   Radiology No results found.  Procedures Procedures (including critical care time)  Medications Ordered in UC Medications - No data to display  Initial Impression / Assessment and Plan / UC Course  I have reviewed the triage vital signs and the nursing notes.  Pertinent labs & imaging results that were available during my care of the patient were reviewed by me and considered in my medical decision making (see chart for details).   1.  Facial laceration Laceration to the soft tissue to the lateral right eye is very close to the right eye.  Laceration can be well-approximated with attempt to bring the skin together, however due to this location and proximity so close to the right eye, I recommend patient go to the nearest emergency department for laceration repair.  Discussed recommendations with patient and his wife who expressed agreement with plan.  Patient discharged in stable condition to [facility] emergency department. Discussed risks of deferring ED visit.  He is neurovascularly intact.  Vital signs are hemodynamically stable.  No indication for [facility] transport.       Final Clinical Impressions(s) / UC Diagnoses   Final diagnoses:  Facial laceration, initial encounter     Discharge Instructions      Please go to the nearest emergency department [facility]) for further evaluation and management of laceration since it is so close to  the eye.     ED Prescriptions   None    PDMP not reviewed this encounter.   Talbot Grumbling, [place] 12/06/22 [phone]  Talbot Grumbling, [place] 12/06/22 [phone]

## 2022-12-06 NOTE — ED Notes (Signed)
Patient Alert and oriented to baseline. Stable and ambulatory to baseline. Patient verbalized understanding of the discharge instructions.  Patient belongings were taken by the patient.   

## 2022-12-06 NOTE — ED Triage Notes (Signed)
Here for facial laceration that happened today. Pt was assisting his neighbor with moving a metal tote, when the tote hit his face.

## 2022-12-06 NOTE — Discharge Instructions (Addendum)
Your sutures are absorbable and will be absorbed into the tissue and will fall out over time.  Have been prescribed Keflex for antibiotic prophylaxis and your tetanus has been updated.

## 2022-12-06 NOTE — Discharge Instructions (Addendum)
Please go to the nearest emergency department [facility] ER) for further evaluation and management of laceration since it is so close to the eye.

## 2023-06-03 ENCOUNTER — Ambulatory Visit (HOSPITAL_COMMUNITY)
Admission: EM | Admit: 2023-06-03 | Discharge: 2023-06-03 | Disposition: A | Discharge status: Home / Self Care | Payer: BC Managed Care – PPO | Attending: Family Medicine | Admitting: Family Medicine

## 2023-06-03 ENCOUNTER — Encounter (HOSPITAL_COMMUNITY): Payer: Self-pay

## 2023-06-03 DIAGNOSIS — Z1152 Encounter for screening for COVID-19: Secondary | ICD-10-CM | POA: Insufficient documentation

## 2023-06-03 DIAGNOSIS — B349 Viral infection, unspecified: Secondary | ICD-10-CM

## 2023-06-03 MED ORDER — ONDANSETRON 4 MG PO TBDP
8.0000 mg | ORAL_TABLET | Freq: Once | ORAL | Status: AC
Start: 2023-06-03 — End: 2023-06-03
  Administered 2023-06-03: 8 mg via ORAL

## 2023-06-03 MED ORDER — FAMOTIDINE 20 MG PO TABS: 20.0000 mg | ORAL_TABLET | Freq: Two times a day (BID) | ORAL | 0 refills | Status: AC

## 2023-06-03 MED ORDER — ACETAMINOPHEN 325 MG PO TABS
650.0000 mg | ORAL_TABLET | Freq: Once | ORAL | Status: AC
  Administered 2023-06-03: 650 mg via ORAL

## 2023-06-03 MED ORDER — ONDANSETRON 4 MG PO TBDP
ORAL_TABLET | ORAL | Status: AC
  Filled 2023-06-03: qty 2

## 2023-06-03 MED ORDER — ONDANSETRON 8 MG PO TBDP
8.0000 mg | ORAL_TABLET | Freq: Three times a day (TID) | ORAL | 0 refills | Status: AC | PRN
Start: 2023-06-03 — End: ?

## 2023-06-03 MED ORDER — ACETAMINOPHEN 325 MG PO TABS
ORAL_TABLET | ORAL | Status: AC
  Filled 2023-06-03: qty 2

## 2023-06-03 MED ORDER — ONDANSETRON 4 MG PO TBDP
ORAL_TABLET | ORAL | Status: AC
Start: 2023-06-03 — End: ?
  Filled 2023-06-03: qty 1

## 2023-06-03 NOTE — Discharge Instructions (Addendum)
If your symptoms do not begin to improve within 48 hours and your COVID test is negative, I would recommend evaluation in the setting of the Emergency Department.  I am prescribing Zofran and famotidine to help improve GI symptoms.  Recommend eating bland foods and forcing fluids until symptoms improve.

## 2023-06-03 NOTE — ED Triage Notes (Signed)
Patient c/o weakness, generalized body aches, and a headache x 6 days.  Patient has taken Tylenol  Cold, Mucinex, ibuprofen.  Patient has not had any medications today.

## 2023-06-03 NOTE — ED Provider Notes (Signed)
[facility]    CSN: [id number] Arrival date & time: 06/03/23  1017      History   Chief Complaint Chief Complaint  Patient presents with   Headache   Generalized Body Aches   Weakness    HPI Eugene Lloyd is a 37 y.o. male.   HPI Patient present today with a 5-day history of headache, generalized bodyaches, weakness and reports subjective fever x 2 days. Reports over the last few days experiencing nausea and right and upper/lower/mid abdominal pain which began over the last few days. He reports poor appetite. Unknown of any sick exposures.  He has been alternating Tylenol and ibuprofen for pain symptoms.  He has a headache.  Denies any other upper respiratory symptoms at present.   No past medical history on file.  There are no problems to display for this patient.   History reviewed. No pertinent surgical history.     Home Medications    Prior to Admission medications   Medication Sig Start Date End Date Taking? Authorizing Provider  famotidine (PEPCID) 20 MG tablet Take 1 tablet (20 mg total) by mouth 2 (two) times daily for 5 days. 06/03/23 06/08/23 Yes Bing Neighbors, NP  ondansetron (ZOFRAN-ODT) 8 MG disintegrating tablet Take 1 tablet (8 mg total) by mouth every 8 (eight) hours as needed for nausea. 06/03/23  Yes Bing Neighbors, NP  amoxicillin (AMOXIL) 500 MG capsule Take 500 mg by mouth 3 (three) times daily. 11/10/22   [provider]  benzonatate (TESSALON) 200 MG capsule Take 1 capsule (200 mg total) by mouth 3 (three) times daily as needed for cough. 11/30/22   Rodriguez-Southworth, Nettie Elm, PA-C  cephALEXin (KEFLEX) 500 MG capsule Take 1 capsule (500 mg total) by mouth 4 (four) times daily. 12/06/22   Ernie Avena, MD  ibuprofen (ADVIL) 800 MG tablet Take 800 mg by mouth every 8 (eight) hours as needed. 11/10/22   [provider]  traMADol (ULTRAM) 50 MG tablet Take 50 mg by mouth every 8 (eight) hours as needed. 11/10/22    [provider]    Family History Family History  Problem Relation Age of Onset   Kidney failure Mother     Social History Social History   Tobacco Use   Smoking status: Former    Types: Cigarettes   Smokeless tobacco: Never  Vaping Use   Vaping status: Never Used  Substance Use Topics   Alcohol use: Never   Drug use: Never     Allergies   Patient has no known allergies.   Review of Systems Review of Systems Pertinent negatives listed in HPI   Physical Exam Triage Vital Signs ED Triage Vitals  Encounter Vitals Group     BP 06/03/23 1055 118/81     Systolic BP Percentile --      Diastolic BP Percentile --      Pulse Rate 06/03/23 1055 86     Resp 06/03/23 1055 16     Temp 06/03/23 1055 98.8 F (37.1 C)     Temp Source 06/03/23 1055 Oral     SpO2 06/03/23 1055 98 %     Weight --      Height --      Head Circumference --      Peak Flow --      Pain Score 06/03/23 1057 7     Pain Loc --      Pain Education --      Exclude from [employer]  Chart --    No data found.  Updated Vital Signs BP 118/81 (BP Location: Right Arm)   Pulse 86   Temp 98.8 F (37.1 C) (Oral)   Resp 16   SpO2 98%   Visual Acuity Right Eye Distance:   Left Eye Distance:   Bilateral Distance:    Right Eye Near:   Left Eye Near:    Bilateral Near:     Physical Exam Vitals reviewed.  Constitutional:      Appearance: He is well-developed.  HENT:     Head: Normocephalic and atraumatic.  Eyes:     Extraocular Movements: Extraocular movements intact.     Pupils: Pupils are equal, round, and reactive to light.  Cardiovascular:     Rate and Rhythm: Normal rate and regular rhythm.  Pulmonary:     Effort: Pulmonary effort is normal.     Breath sounds: Normal breath sounds.  Musculoskeletal:     Cervical back: Normal range of motion and neck supple.  Neurological:     Mental Status: He is alert.     GCS: GCS eye subscore is 4. GCS verbal subscore is 5. GCS motor subscore  is 6.    UC Treatments / Results  Labs (all labs ordered are listed, but only abnormal results are displayed) Labs Reviewed  SARS CORONAVIRUS 2 (TAT 6-24 HRS)    EKG   Radiology No results found.  Procedures Procedures (including critical care time)  Medications Ordered in UC Medications  acetaminophen (TYLENOL) tablet 650 mg (650 mg Oral Given 06/03/23 1217)  ondansetron (ZOFRAN-ODT) disintegrating tablet 8 mg (8 mg Oral Given 06/03/23 1218)    Initial Impression / Assessment and Plan / UC Course  I have reviewed the triage vital signs and the nursing notes.  Pertinent labs & imaging results that were available during my care of the patient were reviewed by me and considered in my medical decision making (see chart for details).    Viral illness, symptom management indicated, treatment per discharge medication orders.  Patient also given Tylenol and Zofran here in clinic for headache and nausea/ COVID test pending.  Provided with a work note to return back to work on 06/07/2023.   Patient advised unable to backdate work note back to 05/27/2023.  Given duration of symptoms I did advise patient if he is not better and his COVID test is negative I would recommend emergent evaluation in the emergency department given overall symptoms. Final Clinical Impressions(s) / UC Diagnoses   Final diagnoses:  Viral illness     Discharge Instructions      If your symptoms do not begin to improve within 48 hours and your COVID test is negative, I would recommend evaluation in the setting of the Emergency Department.  I am prescribing Zofran and famotidine to help improve GI symptoms.  Recommend eating bland foods and forcing fluids until symptoms improve.     ED Prescriptions     Medication Sig Dispense Auth. Provider   famotidine (PEPCID) 20 MG tablet Take 1 tablet (20 mg total) by mouth 2 (two) times daily for 5 days. 10 tablet Bing Neighbors, NP   ondansetron (ZOFRAN-ODT) 8  MG disintegrating tablet Take 1 tablet (8 mg total) by mouth every 8 (eight) hours as needed for nausea. 20 tablet Bing Neighbors, NP      PDMP not reviewed this encounter.   Bing Neighbors, NP 06/03/23 1229
